# Patient Record
Sex: Female | Born: 1935 | Race: White | Hispanic: No | Smoking: Never smoker
Health system: Southern US, Community
[De-identification: ages and names within clinical notes are randomized; demographics above are authoritative.]

## PROBLEM LIST (undated history)

## (undated) DIAGNOSIS — E162 Hypoglycemia, unspecified: Secondary | ICD-10-CM

## (undated) DIAGNOSIS — K219 Gastro-esophageal reflux disease without esophagitis: Secondary | ICD-10-CM

## (undated) DIAGNOSIS — E785 Hyperlipidemia, unspecified: Secondary | ICD-10-CM

## (undated) DIAGNOSIS — I712 Thoracic aortic aneurysm, without rupture, unspecified: Secondary | ICD-10-CM

## (undated) DIAGNOSIS — M21372 Foot drop, left foot: Secondary | ICD-10-CM

## (undated) DIAGNOSIS — F0390 Unspecified dementia without behavioral disturbance: Secondary | ICD-10-CM

## (undated) DIAGNOSIS — D649 Anemia, unspecified: Secondary | ICD-10-CM

## (undated) DIAGNOSIS — N289 Disorder of kidney and ureter, unspecified: Secondary | ICD-10-CM

## (undated) DIAGNOSIS — F419 Anxiety disorder, unspecified: Secondary | ICD-10-CM

## (undated) DIAGNOSIS — J45909 Unspecified asthma, uncomplicated: Secondary | ICD-10-CM

## (undated) DIAGNOSIS — G5600 Carpal tunnel syndrome, unspecified upper limb: Secondary | ICD-10-CM

## (undated) DIAGNOSIS — M1990 Unspecified osteoarthritis, unspecified site: Secondary | ICD-10-CM

## (undated) HISTORY — PX: JOINT REPLACEMENT: SHX530

---

## 2019-01-20 ENCOUNTER — Other Ambulatory Visit: Payer: Self-pay

## 2019-01-20 ENCOUNTER — Emergency Department (HOSPITAL_COMMUNITY): Payer: Medicare Other

## 2019-01-20 ENCOUNTER — Encounter (HOSPITAL_COMMUNITY): Payer: Self-pay | Admitting: Oncology

## 2019-01-20 ENCOUNTER — Emergency Department (HOSPITAL_COMMUNITY)
Admission: EM | Admit: 2019-01-20 | Discharge: 2019-01-20 | Disposition: A | Discharge status: Home / Self Care | Payer: Medicare Other | Attending: Emergency Medicine | Admitting: Emergency Medicine

## 2019-01-20 DIAGNOSIS — S0181XA Laceration without foreign body of other part of head, initial encounter: Secondary | ICD-10-CM | POA: Diagnosis present

## 2019-01-20 DIAGNOSIS — Z23 Encounter for immunization: Secondary | ICD-10-CM | POA: Insufficient documentation

## 2019-01-20 DIAGNOSIS — Y92009 Unspecified place in unspecified non-institutional (private) residence as the place of occurrence of the external cause: Secondary | ICD-10-CM

## 2019-01-20 DIAGNOSIS — S098XXA Other specified injuries of head, initial encounter: Secondary | ICD-10-CM | POA: Insufficient documentation

## 2019-01-20 DIAGNOSIS — Y999 Unspecified external cause status: Secondary | ICD-10-CM | POA: Diagnosis not present

## 2019-01-20 DIAGNOSIS — Y92129 Unspecified place in nursing home as the place of occurrence of the external cause: Secondary | ICD-10-CM | POA: Insufficient documentation

## 2019-01-20 DIAGNOSIS — Y9389 Activity, other specified: Secondary | ICD-10-CM | POA: Insufficient documentation

## 2019-01-20 DIAGNOSIS — W19XXXA Unspecified fall, initial encounter: Secondary | ICD-10-CM

## 2019-01-20 DIAGNOSIS — W0110XA Fall on same level from slipping, tripping and stumbling with subsequent striking against unspecified object, initial encounter: Secondary | ICD-10-CM | POA: Diagnosis not present

## 2019-01-20 DIAGNOSIS — Z96643 Presence of artificial hip joint, bilateral: Secondary | ICD-10-CM | POA: Diagnosis not present

## 2019-01-20 HISTORY — DX: Unspecified dementia, unspecified severity, without behavioral disturbance, psychotic disturbance, mood disturbance, and anxiety: F03.90

## 2019-01-20 HISTORY — DX: Unspecified osteoarthritis, unspecified site: M19.90

## 2019-01-20 HISTORY — DX: Hyperlipidemia, unspecified: E78.5

## 2019-01-20 HISTORY — DX: Foot drop, left foot: M21.372

## 2019-01-20 HISTORY — DX: Unspecified asthma, uncomplicated: J45.909

## 2019-01-20 HISTORY — DX: Hypoglycemia, unspecified: E16.2

## 2019-01-20 HISTORY — DX: Disorder of kidney and ureter, unspecified: N28.9

## 2019-01-20 HISTORY — DX: Thoracic aortic aneurysm, without rupture: I71.2

## 2019-01-20 HISTORY — DX: Thoracic aortic aneurysm, without rupture, unspecified: I71.20

## 2019-01-20 HISTORY — DX: Anemia, unspecified: D64.9

## 2019-01-20 HISTORY — DX: Gastro-esophageal reflux disease without esophagitis: K21.9

## 2019-01-20 HISTORY — DX: Anxiety disorder, unspecified: F41.9

## 2019-01-20 HISTORY — DX: Carpal tunnel syndrome, unspecified upper limb: G56.00

## 2019-01-20 MED ORDER — TETANUS-DIPHTH-ACELL PERTUSSIS 5-2.5-18.5 LF-MCG/0.5 IM SUSP
0.5000 mL | Freq: Once | INTRAMUSCULAR | Status: AC
  Administered 2019-01-20: 0.5 mL via INTRAMUSCULAR
  Filled 2019-01-20 (×2): qty 0.5

## 2019-01-20 MED ORDER — LIDOCAINE-EPINEPHRINE (PF) 2 %-1:200000 IJ SOLN
10.0000 mL | Freq: Once | INTRAMUSCULAR | Status: AC
  Administered 2019-01-20: 10 mL
  Filled 2019-01-20: qty 20

## 2019-01-20 NOTE — ED Triage Notes (Signed)
Pt bib [facility] from [place] floor d/t unwitnessed fall.  Per EMS staff walked by and found pt on floor. Pt has dementia. Pt does report having got up to go to the bathroom and the floor was slick causing her fall and hit her head. +LOC. Laceration to left forehead, skin tear to left elbow and c/o pain to left hip pain.  Shortening rotation noted. Pt on elaquis BID.

## 2019-01-20 NOTE — ED Provider Notes (Signed)
[facility] EMERGENCY DEPARTMENT Provider Note   CSN: [id number] Arrival date & time: 01/20/19  0109    History   Chief Complaint Chief Complaint  Patient presents with   Fall    HPI Casey Gonzales is a 85 y.o. female.   The history is provided by the patient.  She had a slip and fall at home and hit her head suffering a laceration on the left side of her forehead.  She denies other injury.  She was brought in by ambulance who applied a stiff cervical collar.  No past medical history on file.  There are no active problems to display for this patient.   ** The histories are not reviewed yet. Please review them in the "History" navigator section and refresh this SmartLink.   OB History   No obstetric history on file.      Home Medications    Prior to Admission medications   Not on File    Family History No family history on file.  Social History Social History   Tobacco Use   Smoking status: Not on file  Substance Use Topics   Alcohol use: Not on file   Drug use: Not on file     Allergies   Patient has no allergy information on record.   Review of Systems Review of Systems  All other systems reviewed and are negative.    Physical Exam Updated Vital Signs BP 128/90 (BP Location: Right Arm)    Pulse 76    Temp (!) 97.5 F (36.4 C) (Oral)    Resp 14    Ht 5\' 10"  (1.778 m)    Wt 59 kg    SpO2 98%    BMI 18.65 kg/m   Physical Exam Vitals signs and nursing note reviewed.    85 year old female, resting comfortably and in no acute distress. Vital signs are normal. Oxygen saturation is 98%, which is normal. Head is normocephalic.  Laceration is present on the left lateral aspect of the forehead. PERRLA, EOMI. Oropharynx is clear. Neck is immobilized in a stiff cervical collar and is nontender and supple adenopathy or JVD. Back is nontender and there is no CVA tenderness. Lungs are clear without rales, wheezes, or rhonchi. Chest  is nontender. Heart has regular rate and rhythm without murmur. Abdomen is soft, flat, nontender without masses or hepatosplenomegaly and peristalsis is normoactive. Extremities have no cyanosis or edema.  Questionable slight left leg shortening with slight internal rotation. Skin is warm and dry without rash. Neurologic: Mental status is normal, cranial nerves are intact, there are no motor or sensory deficits.     ED Treatments / Results   Radiology Ct Head Wo Contrast  Result Date: 01/20/2019 CLINICAL DATA:  Initial evaluation for acute trauma, fall. EXAM: CT HEAD WITHOUT CONTRAST CT CERVICAL SPINE WITHOUT CONTRAST TECHNIQUE: Multidetector CT imaging of the head and cervical spine was performed following the standard protocol without intravenous contrast. Multiplanar CT image reconstructions of the cervical spine were also generated. COMPARISON:  None available. FINDINGS: CT HEAD FINDINGS Brain: Generalized age-related cerebral atrophy with mild chronic small vessel ischemic disease. No acute intracranial hemorrhage. No acute large vessel territory infarct. No mass lesion, midline shift or mass effect. No hydrocephalus. No extra-axial fluid collection. Vascular: No hyperdense vessel. Scattered vascular calcifications noted within the carotid siphons. Skull: Small soft tissue laceration at the left frontal scalp. Calvarium intact. Sinuses/Orbits: Globes and orbital soft tissues within normal limits. Extensive chronic left maxillary,  left ethmoidal, and sphenoid sinusitis. Mastoid air cells are clear. Other: None. CT CERVICAL SPINE FINDINGS Alignment: Exaggeration of the normal cervical lordosis. Grade 1 facet mediated anterolisthesis of C7 on T1. Underlying mild levoscoliosis. Skull base and vertebrae: Visualized skull base intact. Normal C1-2 articulations are preserved. Dens is intact. Vertebral body height maintained. No acute fracture. Soft tissues and spinal canal: No visible soft tissue injury  within the neck. No abnormal prevertebral edema. Spinal canal demonstrates no acute finding. Disc levels: Advanced multilevel degenerative spondylolysis and facet arthrosis throughout the cervical spine. Extensive degenerative changes about the C1-2 articulation. Upper chest: Visualized upper chest demonstrates no acute finding. Other: None. IMPRESSION: CT BRAIN: 1. No acute intracranial abnormality. 2. Soft tissue laceration at the left frontal scalp. No calvarial fracture. 3. Age-related cerebral atrophy with chronic microvascular ischemic disease. CT CERVICAL SPINE: 1. No acute traumatic injury within the cervical spine. 2. Advanced multilevel cervical spondylolysis and facet arthrosis throughout the cervical spine, with extensive degenerative changes about the C1-2 articulation. Electronically Signed   By: Jeannine Boga M.D.   On: 01/20/2019 06:57   Ct Cervical Spine Wo Contrast  Result Date: 01/20/2019 CLINICAL DATA:  Initial evaluation for acute trauma, fall. EXAM: CT HEAD WITHOUT CONTRAST CT CERVICAL SPINE WITHOUT CONTRAST TECHNIQUE: Multidetector CT imaging of the head and cervical spine was performed following the standard protocol without intravenous contrast. Multiplanar CT image reconstructions of the cervical spine were also generated. COMPARISON:  None available. FINDINGS: CT HEAD FINDINGS Brain: Generalized age-related cerebral atrophy with mild chronic small vessel ischemic disease. No acute intracranial hemorrhage. No acute large vessel territory infarct. No mass lesion, midline shift or mass effect. No hydrocephalus. No extra-axial fluid collection. Vascular: No hyperdense vessel. Scattered vascular calcifications noted within the carotid siphons. Skull: Small soft tissue laceration at the left frontal scalp. Calvarium intact. Sinuses/Orbits: Globes and orbital soft tissues within normal limits. Extensive chronic left maxillary, left ethmoidal, and sphenoid sinusitis. Mastoid air cells  are clear. Other: None. CT CERVICAL SPINE FINDINGS Alignment: Exaggeration of the normal cervical lordosis. Grade 1 facet mediated anterolisthesis of C7 on T1. Underlying mild levoscoliosis. Skull base and vertebrae: Visualized skull base intact. Normal C1-2 articulations are preserved. Dens is intact. Vertebral body height maintained. No acute fracture. Soft tissues and spinal canal: No visible soft tissue injury within the neck. No abnormal prevertebral edema. Spinal canal demonstrates no acute finding. Disc levels: Advanced multilevel degenerative spondylolysis and facet arthrosis throughout the cervical spine. Extensive degenerative changes about the C1-2 articulation. Upper chest: Visualized upper chest demonstrates no acute finding. Other: None. IMPRESSION: CT BRAIN: 1. No acute intracranial abnormality. 2. Soft tissue laceration at the left frontal scalp. No calvarial fracture. 3. Age-related cerebral atrophy with chronic microvascular ischemic disease. CT CERVICAL SPINE: 1. No acute traumatic injury within the cervical spine. 2. Advanced multilevel cervical spondylolysis and facet arthrosis throughout the cervical spine, with extensive degenerative changes about the C1-2 articulation. Electronically Signed   By: Jeannine Boga M.D.   On: 01/20/2019 06:57   Dg Hip Unilat W Or Wo Pelvis 2-3 Views Left  Result Date: 01/20/2019 CLINICAL DATA:  Unwitnessed fall EXAM: DG HIP (WITH OR WITHOUT PELVIS) 2-3V LEFT COMPARISON:  None. FINDINGS: Bilateral hip replacements. No hardware complicating feature. No acute bony abnormality. Specifically, no fracture, subluxation, or dislocation. Degenerative changes in the visualized lower lumbar spine. SI joints symmetric and unremarkable. IMPRESSION: Bilateral hip replacements.  No acute bony abnormality. Electronically Signed   By: Rolm Baptise M.D.  On: 01/20/2019 02:01    Procedures .Marland Kitchen.Laceration Repair  Date/Time: 01/20/2019 3:51 AM Performed by: Dione BoozeGlick, Jamael Hoffmann,  MD Authorized by: Dione BoozeGlick, Chrishawn Boley, MD   Consent:    Consent obtained:  Verbal   Consent given by:  Patient   Risks discussed:  Infection, pain and poor cosmetic result   Alternatives discussed:  No treatment Anesthesia (see MAR for exact dosages):    Anesthesia method:  Local infiltration   Local anesthetic:  Lidocaine 1% WITH epi Laceration details:    Location:  Face   Face location:  Forehead   Length (cm):  3   Depth (mm):  3 Repair type:    Repair type:  Simple Pre-procedure details:    Preparation:  Patient was prepped and draped in usual sterile fashion and imaging obtained to evaluate for foreign bodies Exploration:    Hemostasis achieved with:  Direct pressure   Wound exploration: entire depth of wound probed and visualized     Wound extent: no foreign bodies/material noted and no underlying fracture noted     Contaminated: no   Treatment:    Area cleansed with:  Saline   Amount of cleaning:  Standard Skin repair:    Repair method:  Sutures   Suture size:  5-0   Suture material:  Nylon   Suture technique:  Simple interrupted   Number of sutures:  5 Approximation:    Approximation:  Close Post-procedure details:    Dressing:  Antibiotic ointment   Patient tolerance of procedure:  Tolerated well, no immediate complications     Medications Ordered in ED Medications  Tdap (BOOSTRIX) injection 0.5 mL (has no administration in time range)  lidocaine-EPINEPHrine (XYLOCAINE W/EPI) 2 %-1:200000 (PF) injection 10 mL (has no administration in time range)     Initial Impression / Assessment and Plan / ED Course  I have reviewed the triage vital signs and the nursing notes.  Pertinent labs & imaging results that were available during my care of the patient were reviewed by me and considered in my medical decision making (see chart for details).  Fall with subsequent forehead laceration.  She will be sent for CT of head and cervical spine.  With left leg slightly  shortened, will also check x-ray of left hip.  Old records are reviewed, and she has no relevant past visits.  Hip x-rays show no evidence of fracture or dislocation.  Laceration is closed with sutures.  CT scans show no evidence of intracranial injury or C-spine fracture.  She is discharged with instructions to have sutures removed in 3-5 days.  Final Clinical Impressions(s) / ED Diagnoses   Final diagnoses:  Fall at home, initial encounter  Laceration of forehead, initial encounter    ED Discharge Orders    None       Dione BoozeGlick, Ekaterina Denise, MD 01/20/19 [phone]

## 2019-01-20 NOTE — ED Notes (Signed)
Report called to [facility] at [facility]

## 2020-01-23 IMAGING — CT CT HEAD W/O CM
3 series · 14 of 47 positions shown, 16 images · non-contrast
Comparison: None available.

CLINICAL DATA: Initial evaluation for acute trauma, fall.

EXAM:
CT HEAD WITHOUT CONTRAST
CT CERVICAL SPINE WITHOUT CONTRAST
TECHNIQUE: Multidetector CT imaging of the head and cervical spine was
performed following the standard protocol without intravenous
contrast. Multiplanar CT image reconstructions of the cervical spine
were also generated.

[Series 3: head 5.0 h30s · axial · 0.41mm/px · z∈[-144,-4]mm · 8 of 34 slices shown, 10 images]
[im 3/34  brain]
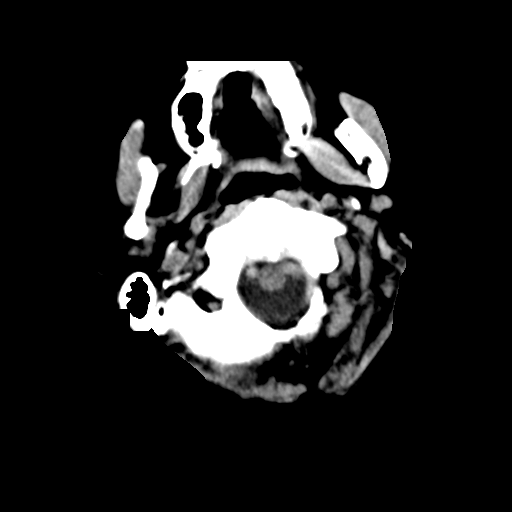
[im 3/34  bone]
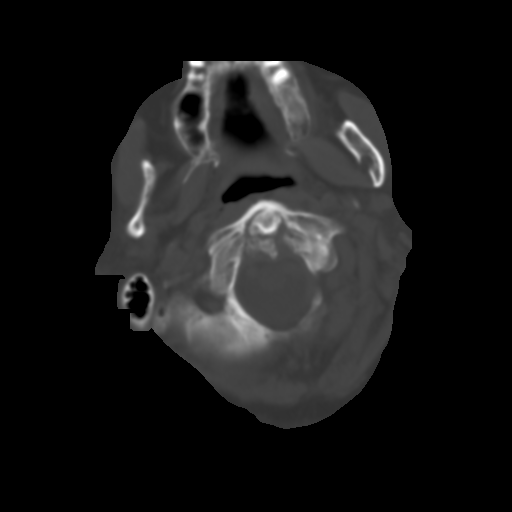
[im 7/34  brain]
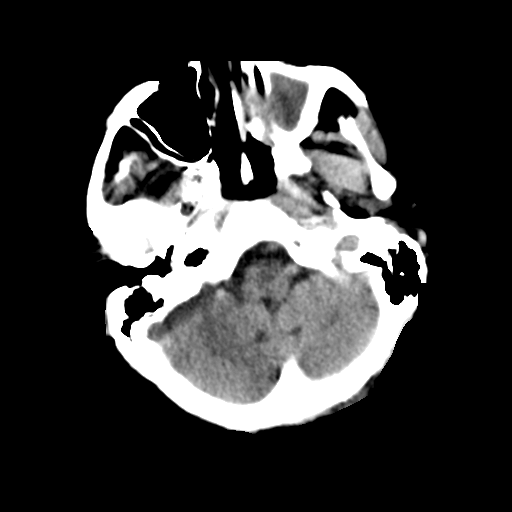
[im 11/34  brain]
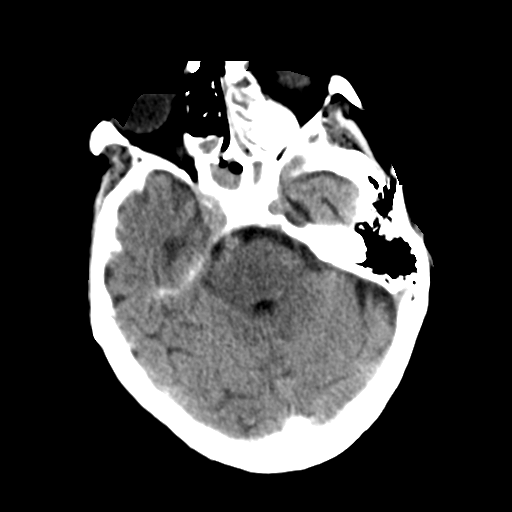
[im 15/34  brain]
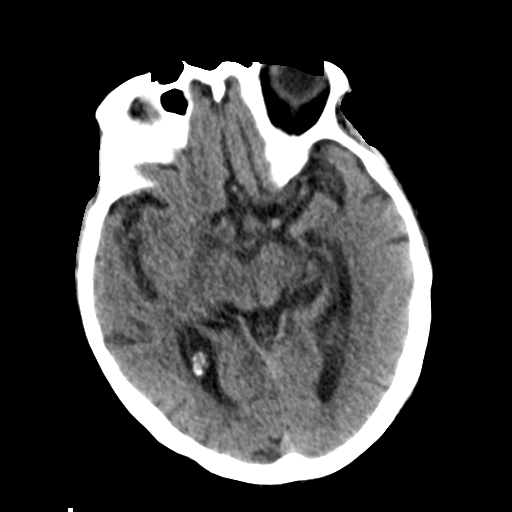
[im 19/34  brain]
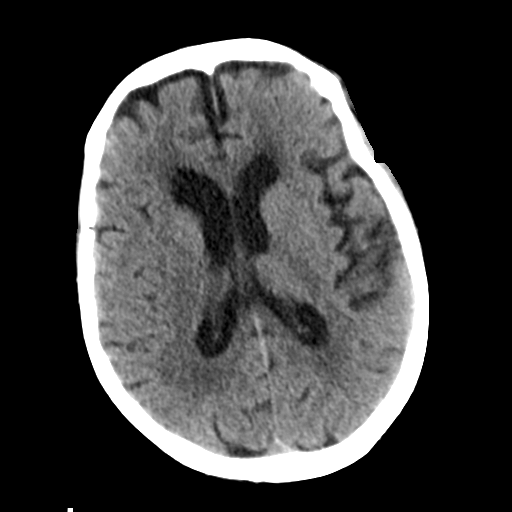
[im 19/34  bone]
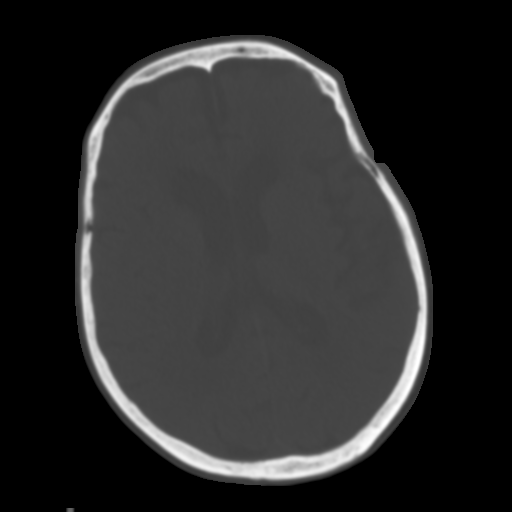
[im 23/34  brain]
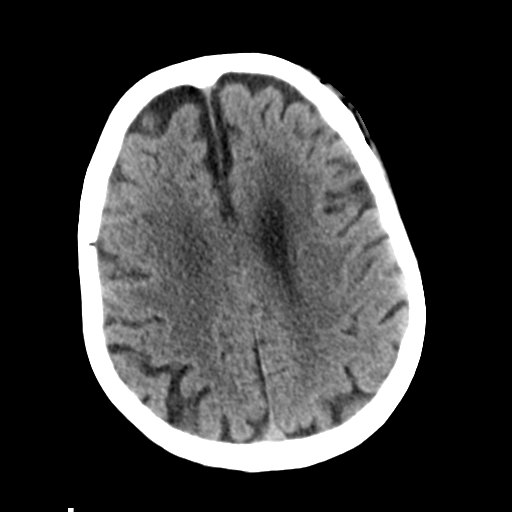
[im 27/34  brain]
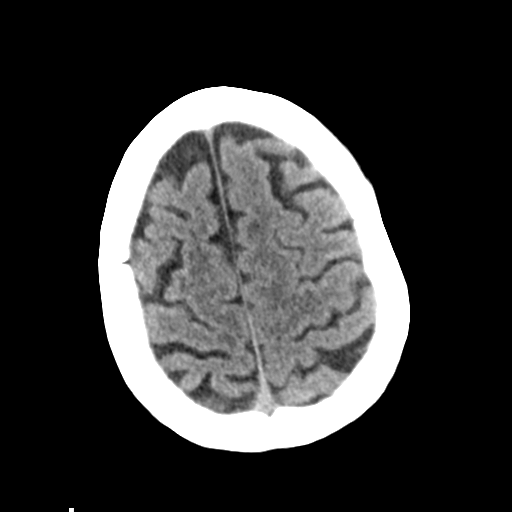
[im 31/34  brain]
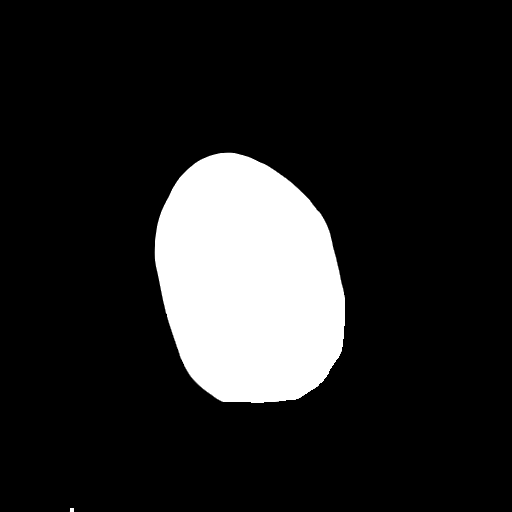

[Series 5: head 3.0 mpr cor · coronal · 0.33mm/px · 3 of 76 slices shown]
[im 26/76  brain]
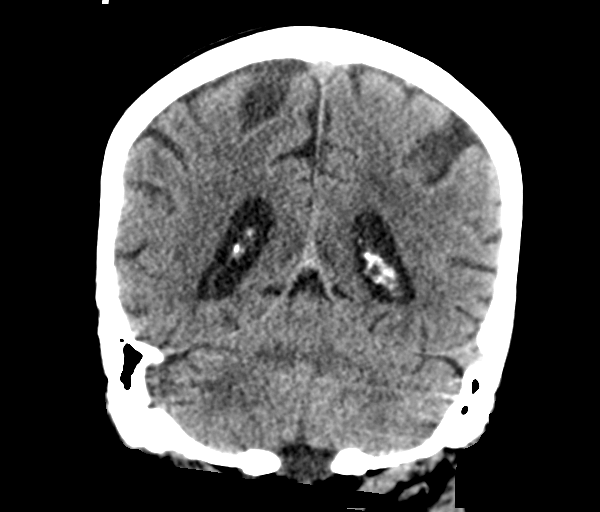
[im 34/76  brain]
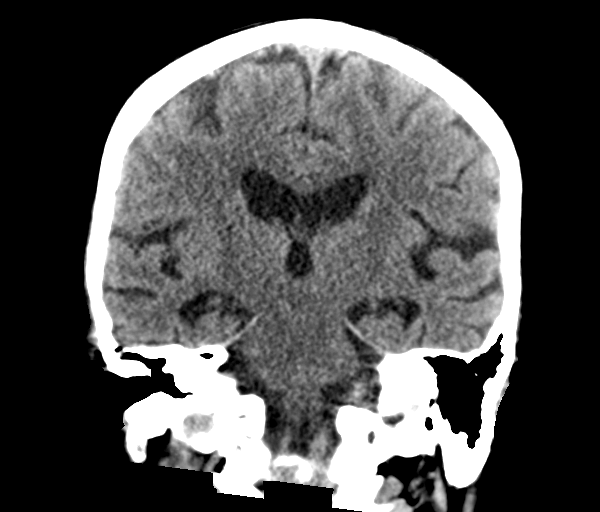
[im 42/76  brain]
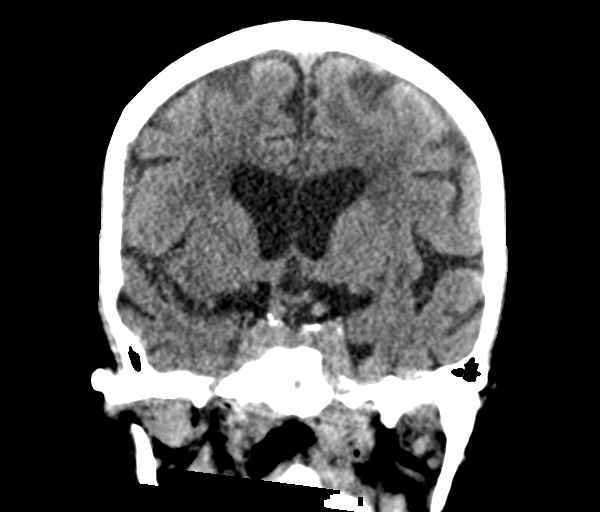

[Series 6: head 3.0 mpr sag · sagittal · 0.33mm/px · 3 of 57 slices shown]
[im 19/57  brain]
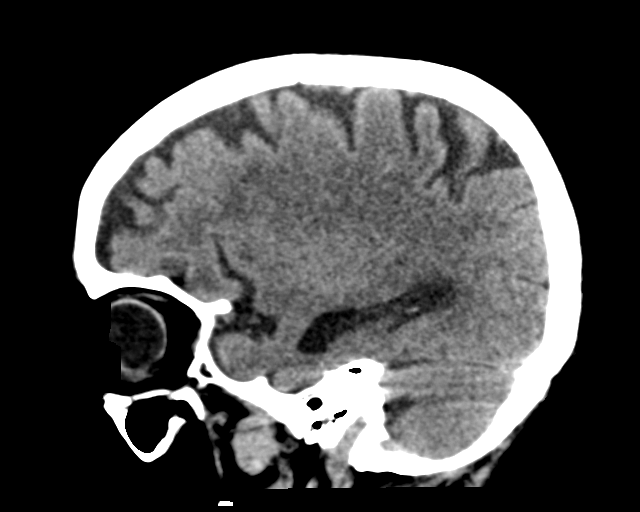
[im 29/57  brain]
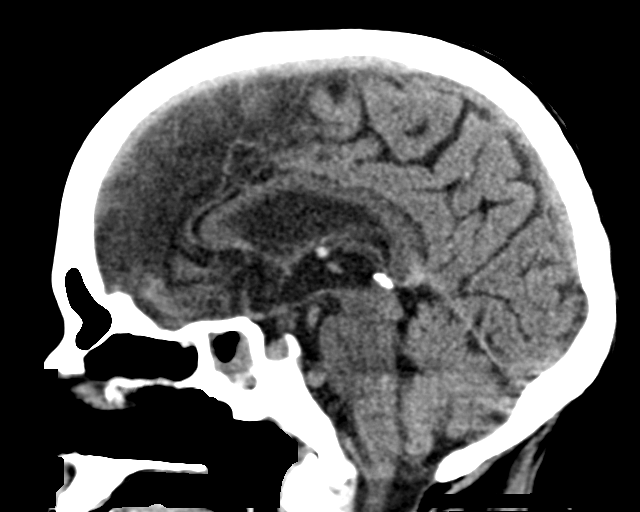
[im 38/57  brain]
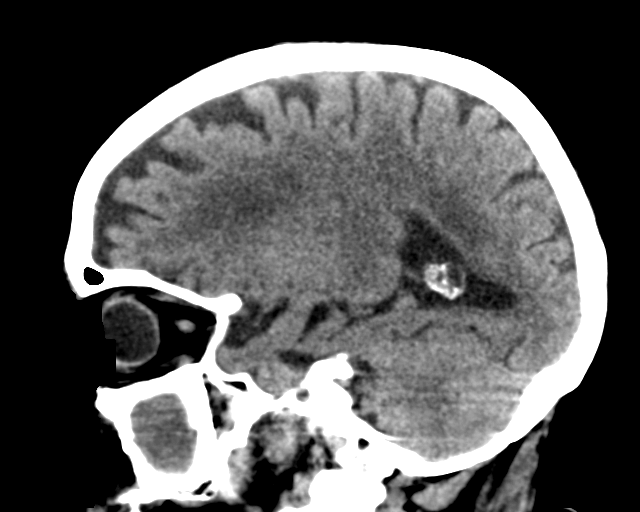

[14 of 47 positions shown; findings below may reference images not displayed]

FINDINGS: CT HEAD FINDINGS

Brain: Generalized age-related cerebral atrophy with mild chronic
small vessel ischemic disease. No acute intracranial hemorrhage. No
acute large vessel territory infarct. No mass lesion, midline shift
or mass effect. No hydrocephalus. No extra-axial fluid collection.

Vascular: No hyperdense vessel. Scattered vascular calcifications
noted within the carotid siphons.

Skull: Small soft tissue laceration at the left frontal scalp.
Calvarium intact.

Sinuses/Orbits: Globes and orbital soft tissues within normal
limits. Extensive chronic left maxillary, left ethmoidal, and
sphenoid sinusitis. Mastoid air cells are clear.

Other: None.

CT CERVICAL SPINE FINDINGS

Alignment: Exaggeration of the normal cervical lordosis. Grade 1
facet mediated anterolisthesis of C7 on T1. Underlying mild
levoscoliosis.

Skull base and vertebrae: Visualized skull base intact. Normal C1-2
articulations are preserved. Dens is intact. Vertebral body height
maintained. No acute fracture.

Soft tissues and spinal canal: No visible soft tissue injury within
the neck. No abnormal prevertebral edema. Spinal canal demonstrates
no acute finding.

Disc levels: Advanced multilevel degenerative spondylolysis and
facet arthrosis throughout the cervical spine. Extensive
degenerative changes about the C1-2 articulation.

Upper chest: Visualized upper chest demonstrates no acute finding.

Other: None.
IMPRESSION: CT BRAIN:

1. No acute intracranial abnormality.
2. Soft tissue laceration at the left frontal scalp. No calvarial
fracture.
3. Age-related cerebral atrophy with chronic microvascular ischemic
disease.

CT CERVICAL SPINE:

1. No acute traumatic injury within the cervical spine.
2. Advanced multilevel cervical spondylolysis and facet arthrosis
throughout the cervical spine, with extensive degenerative changes
about the C1-2 articulation.
